# Patient Record
Sex: Male | Born: 1995 | Race: White | Hispanic: No | Marital: Single | Smoking: Never smoker
Health system: Southern US, Community
[De-identification: ages and names within clinical notes are randomized; demographics above are authoritative.]

## PROBLEM LIST (undated history)

## (undated) DIAGNOSIS — J302 Other seasonal allergic rhinitis: Secondary | ICD-10-CM

## (undated) HISTORY — PX: WISDOM TOOTH EXTRACTION: SHX21

---
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
## 2016-05-29 ENCOUNTER — Ambulatory Visit (INDEPENDENT_AMBULATORY_CARE_PROVIDER_SITE_OTHER): Payer: Managed Care, Other (non HMO) | Admitting: Neurology

## 2016-05-29 ENCOUNTER — Encounter: Payer: Self-pay | Admitting: Neurology

## 2016-05-29 VITALS — BP 124/74 | HR 84 | Ht 73.0 in | Wt 180.0 lb

## 2016-05-29 DIAGNOSIS — R29898 Other symptoms and signs involving the musculoskeletal system: Secondary | ICD-10-CM

## 2016-05-29 DIAGNOSIS — R292 Abnormal reflex: Secondary | ICD-10-CM | POA: Insufficient documentation

## 2016-05-29 DIAGNOSIS — R258 Other abnormal involuntary movements: Secondary | ICD-10-CM | POA: Diagnosis not present

## 2016-05-29 DIAGNOSIS — R253 Fasciculation: Secondary | ICD-10-CM | POA: Diagnosis not present

## 2016-05-29 DIAGNOSIS — R202 Paresthesia of skin: Secondary | ICD-10-CM | POA: Insufficient documentation

## 2016-05-29 NOTE — Progress Notes (Signed)
[facility]    Provider:  Dr Lucia GaskinsAhern Referring Provider: Catha GosselinLittle, Kevin, MD Primary Care Physician:  Mickie HillierLITTLE,KEVIN LORNE, MD  CC:  Muscle twitching  HPI:  Travis PlateBryan H Stanfield Jr. is a 20 y.o. male here as a referral from Dr. Clarene DukeLittle for muscle twitching. He has no significant past medical history. Started during lacrosse season in March in the left biceps femoris, then the shoulder. It got better with supplementation of electrolytes. It happened in the setting of lots of exercise. 2 weeks ago he slept funny in the car and his right forearm developed paresthesias for 4 days, twitches and pressure in the right arm, felt like that for 4 days, it went away and weakness resolved. No weakness currently. No tingling or numbness currently but has had sporadic paresthesias. This past weekend his calfs started twitching, little twitching and fasciculations. Felt like tingly like falling asleep. It has gotten better. They twitch every so often. Sometimes also happens when he doesn't drink water. But sometimes happens without any provoking factor. His right arm felt funny for 4 days, felt weak with pressure, resolved. No autoimmune history in the family, no hx of MS or neuromuscular disorders. No other focal neurologic deficits or history of neurologic symptoms. No fevers or other systemic signs. Here with his father who also provides information. Patient denies significant caffeine use. Denies drug or alcohol use.  Reviewed notes, labs and imaging from outside physicians, which showed: [facility]. Patient plays competitive lacrosse. He presented in March with intermittent muscle twitches. Initially noted his left hamstring lasted for hours. Then got better and then noticed similar symptoms in his calf and then noticed intermittent symptoms at times in both legs. That occurred on his left upper back slam the left side of his nose. Twitching last for hours at most. No previous  history of this. No history of tics. No numbness or weakness. Hydrating better with Gatorade and taking magnesium and potassium supplements improved his symptoms. Exam was normal.  CBC with differential normal, CMP normal with normal creatinine of 1, completed 03/01/2016 CPK was 93 normal, sedimentation rate 1 normal, B12 367 normal TSH 0.65 normal. Patient takes Advil, and pro-air HFA albuterol sulfate 1-2 puffs as needed every 6 hours..  Review of Systems: Patient complains of symptoms per HPI as well as the following symptoms: No chest pain, shortness of breath. Pertinent negatives per HPI. All others negative.   Social History   Social History  . Marital Status: Single    Spouse Name: N/A  . Number of Children: 0  . Years of Education: 15   Occupational History  . Student     Social History Main Topics  . Smoking status: Never Smoker   . Smokeless tobacco: Not on file  . Alcohol Use: No  . Drug Use: No  . Sexual Activity: Not on file   Other Topics Concern  . Not on file   Social History Narrative   Lives with parents   Caffeine use:  none    Family History  Problem Relation Age of Onset  . Ataxia Neg Hx   . Chorea Neg Hx   . Multiple sclerosis Neg Hx   . Neurofibromatosis Neg Hx   . Neuropathy Neg Hx   . Seizures Neg Hx     Past Medical History  Diagnosis Date  . Seasonal allergies     Past Surgical History  Procedure Laterality Date  . Circumcision  2014  . Wisdom tooth  extraction      Current Outpatient Prescriptions  Medication Sig Dispense Refill  . albuterol (PROVENTIL HFA;VENTOLIN HFA) 108 (90 Base) MCG/ACT inhaler Inhale into the lungs every 6 (six) hours as needed for wheezing or shortness of breath.    Marland Kitchen ibuprofen (ADVIL,MOTRIN) 200 MG tablet Take 200 mg by mouth every 6 (six) hours as needed.    . Magnesium 500 MG CAPS Take 1 capsule by mouth daily.    . Multiple Vitamins-Minerals (MULTIVITAMIN WITH MINERALS) tablet Take 1 tablet by mouth  daily.      Marland Kitchen POTASSIUM PO Take 1 Dose by mouth daily.     No current facility-administered medications for this visit.    Allergies as of 05/29/2016  . (No Known Allergies)    Vitals: BP 124/74 mmHg  Pulse 84  Ht  (1.854 m)  Wt 180 lb (81.647 kg)  BMI 23.75 kg/m2 Last Weight:  Wt Readings from Last 1 Encounters:  05/29/16 180 lb (81.647 kg)   Last Height:   Ht Readings from Last 1 Encounters:  05/29/16  (1.854 m)   Physical exam: Exam: Gen: NAD, conversant, well nourised, well groomed                     CV: RRR, no MRG. No Carotid Bruits. No peripheral edema, warm, nontender Eyes: Conjunctivae clear without exudates or hemorrhage  Neuro: Detailed Neurologic Exam  Speech:    Speech is normal; fluent and spontaneous with normal comprehension.  Cognition:    The patient is oriented to person, place, and time;     recent and remote memory intact;     language fluent;     normal attention, concentration,     fund of knowledge Cranial Nerves:    The pupils are equal, round, and reactive to light. The fundi are normal and spontaneous venous pulsations are present. Visual fields are full to finger confrontation. Extraocular movements are intact. Trigeminal sensation is intact and the muscles of mastication are normal. The face is symmetric. The palate elevates in the midline. Hearing intact. Voice is normal. Shoulder shrug is normal. The tongue has normal motion without fasciculations.   Coordination:    Normal finger to nose and heel to shin. Normal rapid alternating movements.   Gait:    Heel-toe and tandem gait are normal.   Motor Observation:    No asymmetry, no atrophy, and no involuntary movements noted. Tone:    Normal muscle tone.    Posture:    Posture is normal. normal erect    Strength:    Strength is V/V in the upper and lower limbs.      Sensation: intact to LT     Reflex Exam:  DTR's:    Deep tendon reflexes in the upper and lower  extremities are brisk bilaterally.   Toes:    The toes are downgoing bilaterally.   Clonus:    Clonus is absent.       Assessment/Plan:  20 year old patient with fasciculations, paresthesias, transient weakness. Given the neurologic symptoms need to rule out intracranial abnormalities such as MS or other. 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 I tried to reassure patient and his father.  EMG nerve conduction study: Right arm and left leg.  We'll perform additional labs today: Magnesium, Lyme, HIV, heavy metals, RPR Recommend MRI of the brain and the cervical spine.  Cc:Dr. Royston Bake,  MD  [facility] [address] [place], [place] [address]  Phone [phone] Fax [phone]

## 2016-05-29 NOTE — Patient Instructions (Addendum)
Remember to drink plenty of fluid, eat healthy meals and do not skip any meals. Try to eat protein with a every meal and eat a healthy snack such as fruit or nuts in between meals. Try to keep a regular sleep-wake schedule and try to exercise daily, particularly in the form of walking, 20-30 minutes a day, if you can.   As far as your medications are concerned, I would like to suggest: stop caffeine, hydrate well, sleep, avoid stress  As far as diagnostic testing: MRI brain and cervical spine, emg/ncs,   Our phone number is [phone]. We also have an after hours call service for urgent matters and there is a physician on-call for urgent questions. For any emergencies you know to call 911 or go to the nearest emergency room

## 2016-05-30 ENCOUNTER — Encounter: Payer: Self-pay | Admitting: Neurology

## 2016-05-30 ENCOUNTER — Telehealth: Payer: Self-pay | Admitting: *Deleted

## 2016-05-30 NOTE — Telephone Encounter (Signed)
Called and spoke to pt about normal labs He verbalized understanding.

## 2016-05-30 NOTE — Telephone Encounter (Signed)
-----   Message from Anson FretAntonia B Ahern, MD sent at 05/30/2016  5:38 PM EDT ----- Labs normal

## 2016-05-31 ENCOUNTER — Ambulatory Visit (INDEPENDENT_AMBULATORY_CARE_PROVIDER_SITE_OTHER): Payer: Self-pay | Admitting: Neurology

## 2016-05-31 ENCOUNTER — Ambulatory Visit (INDEPENDENT_AMBULATORY_CARE_PROVIDER_SITE_OTHER): Payer: Managed Care, Other (non HMO) | Admitting: Neurology

## 2016-05-31 DIAGNOSIS — R253 Fasciculation: Secondary | ICD-10-CM

## 2016-05-31 DIAGNOSIS — R258 Other abnormal involuntary movements: Secondary | ICD-10-CM | POA: Diagnosis not present

## 2016-05-31 DIAGNOSIS — R202 Paresthesia of skin: Secondary | ICD-10-CM

## 2016-05-31 DIAGNOSIS — R29898 Other symptoms and signs involving the musculoskeletal system: Secondary | ICD-10-CM

## 2016-05-31 DIAGNOSIS — Z0289 Encounter for other administrative examinations: Secondary | ICD-10-CM

## 2016-05-31 DIAGNOSIS — R292 Abnormal reflex: Secondary | ICD-10-CM

## 2016-05-31 LAB — HEAVY METALS, BLOOD
ARSENIC: 10 ug/L (ref 2–23)
LEAD, BLOOD: NOT DETECTED ug/dL (ref 0–19)
MERCURY: 1.5 ug/L (ref 0.0–14.9)

## 2016-05-31 LAB — HIV ANTIBODY (ROUTINE TESTING W REFLEX): HIV Screen 4th Generation wRfx: NONREACTIVE

## 2016-05-31 LAB — B. BURGDORFI ANTIBODIES: Lyme IgG/IgM Ab: <0.91 {ISR} (ref 0.00–0.90)

## 2016-05-31 LAB — MAGNESIUM: MAGNESIUM: 2.3 mg/dL (ref 1.6–2.3)

## 2016-05-31 LAB — RPR: RPR: NONREACTIVE

## 2016-05-31 NOTE — Progress Notes (Signed)
  [facility]    Provider:  Dr Lucia Gaskins Referring Provider: Catha Gosselin, MD Primary Care Physician:  Mickie Hillier, MD  History:  Travis Yang. is a 20 y.o. male here as a referral from Dr. Clarene Duke for fasciculations.  Summary  Nerve conduction studies were performed on the right upper and left lower extremities:  Right APB Median motor nerve showed normal conductions with normal F Wave latency Right ADM Ulnar motor nerve showed normal conductions with normal F Wave latency The left Peroneal motor nerve showed normal conductions with normal F Wave latency The left Tibial motor nerve showed normal conductions with normal F Wave latency The right second-digit Median sensory nerve conduction was within normal limits The right fifth-digit Ulnar sensory nerve conduction was within normal limits The left Sural sensory nerve conduction was within normal limits Left H Reflex showed normal latency  EMG Needle study was performed on selected right upper and left lower extremity muscles:   The Deltoid, Triceps, Pronator Teres, Opponens Pollicis, Flexor Digitorum Profundus, Vastus Medialis, Anterior Tibialis, Medial Gastrocnemius, Extensor Hallucis Longus and Abductor Hallucis muscles were within normal limits.  Conclusion: This is a normal study. No electrophysiologic evidence for peripheral polyneuropathy, mononeuropathy, radiculopathy, or neuromuscular disorder.   Naomie Dean, MD  [facility] [address] [place], [place] [address]  Phone [phone] Fax [phone]

## 2016-06-03 NOTE — Procedures (Signed)
[facility]    Provider:  Dr Lucia GaskinsAhern Referring Provider: Catha GosselinLittle, Kevin, MD Primary Care Physician:  Mickie HillierLITTLE,KEVIN LORNE, MD  History:  Rolla PlateBryan H Sahakian Jr. is a 20 y.o. male here as a referral from Dr. Clarene DukeLittle for fasciculations.  Summary  Nerve conduction studies were performed on the right upper and left lower extremities:  Right APB Median motor nerve showed normal conductions with normal F Wave latency Right ADM Ulnar motor nerve showed normal conductions with normal F Wave latency The left Peroneal motor nerve showed normal conductions with normal F Wave latency The left Tibial motor nerve showed normal conductions with normal F Wave latency The right second-digit Median sensory nerve conduction was within normal limits The right fifth-digit Ulnar sensory nerve conduction was within normal limits The left Sural sensory nerve conduction was within normal limits Left H Reflex showed normal latency  EMG Needle study was performed on selected right upper and left lower extremity muscles:   The Deltoid, Triceps, Pronator Teres, Opponens Pollicis, Flexor Digitorum Profundus, Vastus Medialis, Anterior Tibialis, Medial Gastrocnemius, Extensor Hallucis Longus and Abductor Hallucis muscles were within normal limits.  Conclusion: This is a normal study. No electrophysiologic evidence for peripheral polyneuropathy, mononeuropathy, radiculopathy, or neuromuscular disorder.   Naomie DeanAntonia Ahern, MD  [facility] [address] [place], [place] [address]  Phone [phone] Fax [phone]

## 2016-06-03 NOTE — Progress Notes (Signed)
See procedure note.

## 2016-07-10 ENCOUNTER — Ambulatory Visit
Admission: RE | Admit: 2016-07-10 | Discharge: 2016-07-10 | Disposition: A | Discharge status: Home / Self Care | Payer: Managed Care, Other (non HMO) | Source: Ambulatory Visit | Attending: Neurology | Admitting: Neurology

## 2016-07-10 DIAGNOSIS — R292 Abnormal reflex: Secondary | ICD-10-CM | POA: Diagnosis not present

## 2016-07-10 DIAGNOSIS — R29898 Other symptoms and signs involving the musculoskeletal system: Secondary | ICD-10-CM

## 2016-07-10 DIAGNOSIS — R253 Fasciculation: Secondary | ICD-10-CM

## 2016-07-10 DIAGNOSIS — R202 Paresthesia of skin: Secondary | ICD-10-CM | POA: Diagnosis not present

## 2016-07-10 MED ORDER — GADOBENATE DIMEGLUMINE 529 MG/ML IV SOLN
20.0000 mL | Freq: Once | INTRAVENOUS | Status: AC | PRN
  Administered 2016-07-10: 17 mL via INTRAVENOUS

## 2016-07-11 ENCOUNTER — Telehealth: Payer: Self-pay | Admitting: *Deleted

## 2016-07-11 NOTE — Telephone Encounter (Signed)
-----   Message from Anson Fret, MD sent at 07/11/2016 10:10 AM EDT ----- MRI of the brain and cervical spine normal

## 2016-07-11 NOTE — Telephone Encounter (Signed)
Patient returned Emma's call, per Kara Mead, patient advised MRI normal.

## 2016-07-11 NOTE — Telephone Encounter (Signed)
LVM for pt to call about results. Ok to inform pt MRI brain/cervical spine normal per Dr Lucia Gaskins.

## 2017-07-15 ENCOUNTER — Ambulatory Visit (INDEPENDENT_AMBULATORY_CARE_PROVIDER_SITE_OTHER): Payer: 59 | Admitting: Neurology

## 2017-07-15 ENCOUNTER — Encounter (INDEPENDENT_AMBULATORY_CARE_PROVIDER_SITE_OTHER): Payer: Self-pay

## 2017-07-15 ENCOUNTER — Encounter: Payer: Self-pay | Admitting: Neurology

## 2017-07-15 VITALS — BP 130/80 | HR 67 | Ht 73.0 in | Wt 182.4 lb

## 2017-07-15 DIAGNOSIS — E538 Deficiency of other specified B group vitamins: Secondary | ICD-10-CM | POA: Diagnosis not present

## 2017-07-15 DIAGNOSIS — R253 Fasciculation: Secondary | ICD-10-CM

## 2017-07-15 MED ORDER — PROPRANOLOL HCL 10 MG PO TABS: 10.0000 mg | ORAL_TABLET | Freq: Three times a day (TID) | ORAL | 6 refills | Status: AC | PRN

## 2017-07-15 NOTE — Progress Notes (Signed)
[facility]    Provider:  Dr Lucia GaskinsAhern Referring Provider: Catha GosselinLittle, Kevin, MD Primary Care Physician:  Catha GosselinLittle, Kevin, MD  CC:  Muscle twitching  Interval history 07/15/2017: Patient continues to have fasciculations today he is here for a new problem he feels as though his gastrocnemius muscles appear asymmetric or one is smaller than the other. Discussed fasciculations, causes, avoiding caffeine, reducing stress, reduce alcohol intake, stay well-hydrated. 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 Also the medial gastrocnemius muscles were normal on EMG last year.  HPI:  Travis PlateBryan H Gidney Jr. is a 21 y.o. male here as a referral from Dr. Clarene DukeLittle for muscle twitching. He has no significant past medical history. Started during lacrosse season in March in the left biceps femoris, then the shoulder. It got better with supplementation of electrolytes. It happened in the setting of lots of exercise. 2 weeks ago he slept funny in the car and his right forearm developed paresthesias for 4 days, twitches and pressure in the right arm, felt like that for 4 days, it went away and weakness resolved. No weakness currently. No tingling or numbness currently but has had sporadic paresthesias. This past weekend his calfs started twitching, Yang twitching and fasciculations. Felt like tingly like falling asleep. It has gotten better. They twitch every so often. Sometimes also happens when he doesn't drink water. But sometimes happens without any provoking factor. His right arm felt funny for 4 days, felt weak with pressure, resolved. No autoimmune history in the family, no hx of MS or neuromuscular disorders. No other focal neurologic deficits or history of neurologic symptoms. No fevers or other systemic  signs. Here with his father who also provides information. Patient denies significant caffeine use. Denies drug or alcohol use.  Reviewed notes, labs and imaging from outside physicians, which showed: [facility]. Patient plays competitive lacrosse. He presented in March with intermittent muscle twitches. Initially noted his left hamstring lasted for hours. Then got better and then noticed similar symptoms in his calf and then noticed intermittent symptoms at times in both legs. That occurred on his left upper back slam the left side of his nose. Twitching last for hours at most. No previous history of this. No history of tics. No numbness or weakness. Hydrating better with Gatorade and taking magnesium and potassium supplements improved his symptoms. Exam was normal.  CBC with differential normal, CMP normal with normal creatinine of 1, completed 03/01/2016 CPK was 93 normal, sedimentation rate 1 normal, B12 367 normal TSH 0.65 normal. Patient takes Advil, and pro-air HFA albuterol sulfate 1-2 puffs as needed every 6 hours..  Review of Systems: Patient complains of symptoms per HPI as well as the following symptoms: No chest pain, shortness of breath. Pertinent negatives per HPI. All others negative.    Social History   Social History  . Marital status: Single    Spouse name: N/A  . Number of children: 0  . Years of education: 3716   Occupational History  . Student     Social History Main Topics  . Smoking status: Never Smoker  . Smokeless tobacco: Never Used  . Alcohol use No  . Drug use: No  . Sexual activity: Not on file   Other Topics Concern  . Not  on file   Social History Narrative   Lives with parents   Caffeine use:  none    Family History  Problem Relation Age of Onset  . Ataxia Neg Hx   . Chorea Neg Hx   . Multiple sclerosis Neg Hx   . Neurofibromatosis Neg Hx   . Neuropathy Neg Hx   . Seizures Neg Hx     Past Medical History:    Diagnosis Date  . Seasonal allergies     Past Surgical History:  Procedure Laterality Date  . CIRCUMCISION  2014  . WISDOM TOOTH EXTRACTION      Current Outpatient Prescriptions  Medication Sig Dispense Refill  . albuterol (PROVENTIL HFA;VENTOLIN HFA) 108 (90 Base) MCG/ACT inhaler Inhale into the lungs every 6 (six) hours as needed for wheezing or shortness of breath.    Marland Kitchen ibuprofen (ADVIL,MOTRIN) 200 MG tablet Take 200 mg by mouth every 6 (six) hours as needed.    . Magnesium 500 MG CAPS Take 1 capsule by mouth daily.    . Multiple Vitamins-Minerals (MULTIVITAMIN WITH MINERALS) tablet Take 1 tablet by mouth daily.       No current facility-administered medications for this visit.     Allergies as of 07/15/2017  . (No Known Allergies)    Vitals: BP 130/80   Pulse 67   Ht 6\' 1"  (1.854 m)   Wt 182 lb 6.4 oz (82.7 kg)   BMI 24.06 kg/m  Last Weight:  Wt Readings from Last 1 Encounters:  07/15/17 182 lb 6.4 oz (82.7 kg)   Last Height:   Ht Readings from Last 1 Encounters:  07/15/17 6\' 1"  (1.854 m)   Physical exam: Exam: Gen: NAD, conversant, well nourised, well groomed                     CV: RRR, no MRG. No Carotid Bruits. No peripheral edema, warm, nontender Eyes: Conjunctivae clear without exudates or hemorrhage  Neuro: Detailed Neurologic Exam  Speech:    Speech is normal; fluent and spontaneous with normal comprehension.  Cognition:    The patient is oriented to person, place, and time;     recent and remote memory intact;     language fluent;     normal attention, concentration,     fund of knowledge Cranial Nerves:    The pupils are equal, round, and reactive to light. The fundi are normal and spontaneous venous pulsations are present. Visual fields are full to finger confrontation. Extraocular movements are intact. Trigeminal sensation is intact and the muscles of mastication are normal. The face is symmetric. The palate elevates in the midline. Hearing  intact. Voice is normal. Shoulder shrug is normal. The tongue has normal motion without fasciculations.   Coordination:    Normal finger to nose and heel to shin. Normal rapid alternating movements.   Gait:    Heel-toe and tandem gait are normal.   Motor Observation:    No asymmetry, no atrophy, and no involuntary movements noted. Tone:    Normal muscle tone.    Posture:    Posture is normal. normal erect    Strength:    Strength is V/V in the upper and lower limbs.      Sensation: intact to LT     Reflex Exam:  DTR's:    Deep tendon reflexes in the upper and lower extremities are brisk bilaterally.   Toes:    The toes are downgoing bilaterally.   Clonus:  Clonus is absent.       Assessment/Plan:  21 year old patient with fasciculations, paresthesias, transient weakness. Given the neurologic symptoms need to rule out intracranial abnormalities such as MS or other. 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 I tried to reassure patient and his father.  EMG nerve conduction study: Right arm and left leg: were normal We'll perform additional labs today: Magnesium, Lyme, HIV, heavy metals, RPR: were normal Recommend MRI of the brain and the cervical spine: were normal  Will check B12 as it was low normal last year, will check B12 and methylmalonic acid today We'll start propranolol when necessary for fasciculations discussed side effects Feels his calf muscles are symmetric:  Gastrocnemius muscles are equal in measurement/circumference today as well as strength, I don't think there is a muscular issue here Return to clinic as needed.  Cc:Dr. Royston BakeLittle  Ashira Kirsten, MD  [facility] [address] [place], [place] [id number]  Phone [phone] Fax [phone]  A total of 25 minutes was spent face-to-face with this patient. Over half this time was  spent on counseling patient on the benign fasciculations, muscle asymmetry of the gastrocnemius diagnosis and different diagnostic and therapeutic options available.

## 2017-07-15 NOTE — Patient Instructions (Signed)
Remember to drink plenty of fluid, eat healthy meals and do not skip any meals. Try to eat protein with a every meal and eat a healthy snack such as fruit or nuts in between meals. Try to keep a regular sleep-wake schedule and try to exercise daily, particularly in the form of walking, 20-30 minutes a day, if you can.   As far as your medications are concerned, I would like to suggest: Propranolol 10mg  2-3 times daily as needed  As far as diagnostic testing: Labs  I would like to see you back in as needed, sooner if we need to. Please call us with any interim questions, concerns, problems, updates or refill requests.    Our phone number is [phone]. We also have an after hours call service for urgent matters and there is a physician on-call for urgent questions. For any emergencies you know to call 911 or go to the nearest emergency room  Propranolol tablets What is this medicine? PROPRANOLOL (proe PRAN oh lole) is a beta-blocker. Beta-blockers reduce the workload on the heart and help it to beat more regularly. This medicine is used to treat high blood pressure, to control irregular heart rhythms (arrhythmias) and to relieve chest pain caused by angina. It may also be helpful after a heart attack. This medicine is also used to prevent migraine headaches, relieve uncontrollable shaking (tremors), and help certain problems related to the thyroid gland and adrenal gland. This medicine may be used for other purposes; ask your health care provider or pharmacist if you have questions. COMMON BRAND NAME(S): Inderal What should I tell my health care provider before I take this medicine? They need to know if you have any of these conditions: -circulation problems or blood vessel disease -diabetes -history of heart attack or heart disease, vasospastic angina -kidney disease -liver disease -lung or breathing disease, like asthma or emphysema -pheochromocytoma -slow heart rate -thyroid disease -an  unusual or allergic reaction to propranolol, other beta-blockers, medicines, foods, dyes, or preservatives -pregnant or trying to get pregnant -breast-feeding How should I use this medicine? Take this medicine by mouth with a glass of water. Follow the directions on the prescription label. Take your doses at regular intervals. Do not take your medicine more often than directed. Do not stop taking except on your the advice of your doctor or health care professional. Talk to your pediatrician regarding the use of this medicine in children. Special care may be needed. Overdosage: If you think you have taken too much of this medicine contact a poison control center or emergency room at once. NOTE: This medicine is only for you. Do not share this medicine with others. What if I miss a dose? If you miss a dose, take it as soon as you can. If it is almost time for your next dose, take only that dose. Do not take double or extra doses. What may interact with this medicine? Do not take this medicine with any of the following medications: -feverfew -phenothiazines like chlorpromazine, mesoridazine, prochlorperazine, thioridazine This medicine may also interact with the following medications: -aluminum hydroxide gel -antipyrine -antiviral medicines for HIV or AIDS -barbiturates like phenobarbital -certain medicines for blood pressure, heart disease, irregular heart beat -cimetidine -ciprofloxacin -diazepam -fluconazole -haloperidol -isoniazid -medicines for cholesterol like cholestyramine or colestipol -medicines for mental depression -medicines for migraine headache like almotriptan, eletriptan, frovatriptan, naratriptan, rizatriptan, sumatriptan, zolmitriptan -NSAIDs, medicines for pain and inflammation, like ibuprofen or naproxen -phenytoin -rifampin -teniposide -theophylline -thyroid medicines -tolbutamide -warfarin -zileuton This  list may not describe all possible interactions. Give  your health care provider a list of all the medicines, herbs, non-prescription drugs, or dietary supplements you use. Also tell them if you smoke, drink alcohol, or use illegal drugs. Some items may interact with your medicine. What should I watch for while using this medicine? Visit your doctor or health care professional for regular check ups. Check your blood pressure and pulse rate regularly. Ask your health care professional what your blood pressure and pulse rate should be, and when you should contact them. You may get drowsy or dizzy. Do not drive, use machinery, or do anything that needs mental alertness until you know how this drug affects you. Do not stand or sit up quickly, especially if you are an older patient. This reduces the risk of dizzy or fainting spells. Alcohol can make you more drowsy and dizzy. Avoid alcoholic drinks. This medicine can affect blood sugar levels. If you have diabetes, check with your doctor or health care professional before you change your diet or the dose of your diabetic medicine. Do not treat yourself for coughs, colds, or pain while you are taking this medicine without asking your doctor or health care professional for advice. Some ingredients may increase your blood pressure. What side effects may I notice from receiving this medicine? Side effects that you should report to your doctor or health care professional as soon as possible: -allergic reactions like skin rash, itching or hives, swelling of the face, lips, or tongue -breathing problems -changes in blood sugar -cold hands or feet -difficulty sleeping, nightmares -dry peeling skin -hallucinations -muscle cramps or weakness -slow heart rate -swelling of the legs and ankles -vomiting Side effects that usually do not require medical attention (report to your doctor or health care professional if they continue or are bothersome): -change in sex drive or performance -diarrhea -dry sore eyes -hair  loss -nausea -weak or tired This list may not describe all possible side effects. Call your doctor for medical advice about side effects. You may report side effects to FDA at 1-800-FDA-1088. Where should I keep my medicine? Keep out of the reach of children. Store at room temperature between 15 and 30 degrees C (59 and 86 degrees F). Protect from light. Throw away any unused medicine after the expiration date. NOTE: This sheet is a summary. It may not cover all possible information. If you have questions about this medicine, talk to your doctor, pharmacist, or health care provider.  2018 [employer]vier/Gold Standard (2013-08-06 14:51:53)

## 2017-07-16 ENCOUNTER — Telehealth: Payer: Self-pay | Admitting: Neurology

## 2017-07-16 NOTE — Telephone Encounter (Signed)
-----   Message from Anson FretAntonia B Ahern, MD sent at 07/16/2017 11:28 AM EDT ----- Labs normal thanks

## 2017-07-18 LAB — THYROID PANEL WITH TSH
Free Thyroxine Index: 1.7 (ref 1.2–4.9)
T3 Uptake Ratio: 27 % (ref 24–39)
T4 TOTAL: 6.4 ug/dL (ref 4.5–12.0)
TSH: 0.873 u[IU]/mL (ref 0.450–4.500)

## 2017-07-18 LAB — B12 AND FOLATE PANEL
FOLATE: 14.8 ng/mL (ref >3.0)
Vitamin B-12: 416 pg/mL (ref 232–1245)

## 2017-07-18 LAB — METHYLMALONIC ACID, SERUM: METHYLMALONIC ACID: 110 nmol/L (ref 0–378)
